# Patient Record
Sex: Female | Born: 1935 | Race: White | Hispanic: No | Marital: Married | Smoking: Former smoker
Health system: Southern US, Community
[De-identification: ages and names within clinical notes are randomized; demographics above are authoritative.]

## PROBLEM LIST (undated history)

## (undated) DIAGNOSIS — I10 Essential (primary) hypertension: Secondary | ICD-10-CM

## (undated) DIAGNOSIS — I712 Thoracic aortic aneurysm, without rupture: Secondary | ICD-10-CM

## (undated) DIAGNOSIS — J449 Chronic obstructive pulmonary disease, unspecified: Secondary | ICD-10-CM

## (undated) DIAGNOSIS — I739 Peripheral vascular disease, unspecified: Secondary | ICD-10-CM

## (undated) DIAGNOSIS — I2510 Atherosclerotic heart disease of native coronary artery without angina pectoris: Secondary | ICD-10-CM

## (undated) DIAGNOSIS — Z720 Tobacco use: Secondary | ICD-10-CM

## (undated) DIAGNOSIS — R9431 Abnormal electrocardiogram [ECG] [EKG]: Secondary | ICD-10-CM

## (undated) HISTORY — PX: FETAL SURGERY FOR CONGENITAL HERNIA: SHX1618

## (undated) HISTORY — DX: Essential (primary) hypertension: I10

## (undated) HISTORY — DX: Abnormal electrocardiogram (ECG) (EKG): R94.31

## (undated) HISTORY — DX: Chronic obstructive pulmonary disease, unspecified: J44.9

## (undated) HISTORY — DX: Thoracic aortic aneurysm, without rupture: I71.2

## (undated) HISTORY — DX: Tobacco use: Z72.0

## (undated) HISTORY — DX: Atherosclerotic heart disease of native coronary artery without angina pectoris: I25.10

## (undated) HISTORY — DX: Peripheral vascular disease, unspecified: I73.9

---

## 1976-04-06 HISTORY — PX: HYSTEROTOMY: SHX1776

## 1990-04-06 HISTORY — PX: PLACEMENT OF BREAST IMPLANTS: SHX6334

## 1990-04-06 HISTORY — PX: BREAST IMPLANT EXCHANGE: SHX6296

## 2000-04-06 HISTORY — PX: NASAL SINUS SURGERY: SHX719

## 2001-05-12 ENCOUNTER — Ambulatory Visit (HOSPITAL_COMMUNITY): Admission: RE | Admit: 2001-05-12 | Discharge: 2001-05-12 | Payer: Self-pay | Admitting: *Deleted

## 2003-04-07 HISTORY — PX: EYE SURGERY: SHX253

## 2003-04-07 HISTORY — PX: HIP SURGERY: SHX245

## 2003-04-07 HISTORY — PX: FEMUR FRACTURE SURGERY: SHX633

## 2013-07-05 ENCOUNTER — Telehealth: Payer: Self-pay | Admitting: Interventional Cardiology

## 2013-07-05 NOTE — Telephone Encounter (Signed)
New Message:  Pt states she has a thoracic aneurysm.. Pt would like Dr. Katrinka BlazingSmith to recommend a surgeon.

## 2013-07-06 NOTE — Telephone Encounter (Signed)
returned pt call.pt sts that she has had a ct done with [facility].pt sts thats she was told she has a thoracic aneurysm.pt sts that she has not been given any other info.i.e.if she just needs f/u or surgery.pt has been calling around on her own trying to find a Careers advisersurgeon.I adv pt to contact the ordering physician for their  recommendations and a better explanation on how dx should be treated.pt is not a pt of Dr.Smith, pt husband is.she is going to the [facility] office today to sign a release to have her records fwd to this office for Dr.Smith to review.adv pt that Dr.Smith wont return to the office until 07/11/13.I will send him a message FYI. pt wants Dr.Smith to recommend a surgeon.adv her that Dr.Smith could review her records and possibly give a recommendation after reviewing.pt and pt husband are quite concerned.pt husband called the office yesterday yelling at the operator.pt thanked me for my help.is agreeable with plan and verbalized understanding.

## 2013-07-06 NOTE — Telephone Encounter (Signed)
Got it.

## 2013-08-15 ENCOUNTER — Telehealth: Payer: Self-pay | Admitting: Interventional Cardiology

## 2013-08-15 NOTE — Telephone Encounter (Signed)
returned pt call.pt would like a  new pt appt with Dr.Smith for dx aortic thoracic aneurysm.appt made for 08/31/13 @4pm . pt will mail records to office today.pt aware od appt and verbalized understanding.

## 2013-08-15 NOTE — Telephone Encounter (Signed)
New message     Talk to Misty StanleyLisa about a conversation she had with you about setting up a new pt appt with Dr Katrinka BlazingSmith

## 2013-08-31 ENCOUNTER — Encounter: Payer: Self-pay | Admitting: Interventional Cardiology

## 2013-08-31 ENCOUNTER — Ambulatory Visit (INDEPENDENT_AMBULATORY_CARE_PROVIDER_SITE_OTHER): Payer: Medicare Other | Admitting: Interventional Cardiology

## 2013-08-31 VITALS — BP 124/66 | HR 66 | Ht 63.0 in | Wt 101.0 lb

## 2013-08-31 DIAGNOSIS — I10 Essential (primary) hypertension: Secondary | ICD-10-CM

## 2013-08-31 DIAGNOSIS — I341 Nonrheumatic mitral (valve) prolapse: Secondary | ICD-10-CM

## 2013-08-31 DIAGNOSIS — I712 Thoracic aortic aneurysm, without rupture, unspecified: Secondary | ICD-10-CM

## 2013-08-31 DIAGNOSIS — F17200 Nicotine dependence, unspecified, uncomplicated: Secondary | ICD-10-CM

## 2013-08-31 DIAGNOSIS — Z720 Tobacco use: Secondary | ICD-10-CM

## 2013-08-31 DIAGNOSIS — J449 Chronic obstructive pulmonary disease, unspecified: Secondary | ICD-10-CM | POA: Insufficient documentation

## 2013-08-31 DIAGNOSIS — R002 Palpitations: Secondary | ICD-10-CM | POA: Insufficient documentation

## 2013-08-31 DIAGNOSIS — R9431 Abnormal electrocardiogram [ECG] [EKG]: Secondary | ICD-10-CM

## 2013-08-31 DIAGNOSIS — I739 Peripheral vascular disease, unspecified: Secondary | ICD-10-CM | POA: Insufficient documentation

## 2013-08-31 DIAGNOSIS — I059 Rheumatic mitral valve disease, unspecified: Secondary | ICD-10-CM

## 2013-08-31 DIAGNOSIS — Z8679 Personal history of other diseases of the circulatory system: Secondary | ICD-10-CM | POA: Insufficient documentation

## 2013-08-31 HISTORY — DX: Peripheral vascular disease, unspecified: I73.9

## 2013-08-31 HISTORY — DX: Tobacco use: Z72.0

## 2013-08-31 HISTORY — DX: Thoracic aortic aneurysm, without rupture, unspecified: I71.20

## 2013-08-31 HISTORY — DX: Thoracic aortic aneurysm, without rupture: I71.2

## 2013-08-31 HISTORY — DX: Chronic obstructive pulmonary disease, unspecified: J44.9

## 2013-08-31 HISTORY — DX: Abnormal electrocardiogram (ECG) (EKG): R94.31

## 2013-08-31 HISTORY — DX: Essential (primary) hypertension: I10

## 2013-08-31 NOTE — Progress Notes (Signed)
Patient ID: Lindsey Martinez, female   DOB: [birth year], 80 y.o.   MRN: [id number]   Date: 08/31/2013 ID: Lindsey Martinez, DOB 12-24-1930, MRN [id number] PCP: No primary provider on file.  Reason: Establish cardiac followup  ASSESSMENT;  1. Mitral valve prolapse, questionable diagnosis 2. Peripheral arterial disease 3. Abnormal EKG with anterior T-wave abnormality 4. Lower thoracic aneurysm by CT scan of the abdomen and pelvis performed recently  PLAN:  1. May need to have a chest CT performed to rule out ascending and descending aortic aneurysm 2. No specific cardiac evaluation needed at this time 3. Risk factor modification including statin therapy and smoking cessation were discussed. She does not feel she would be able to stop smoking. She states that her lipid levels have not been evaluated recently. 4. Fasting lipid panel today. She will need statin therapy if we document LDL greater than 100   SUBJECTIVE: Lindsey Martinez is a 80 y.o. female who is his asymptomatic from a cardiac viewpoint. She has had palpitations in the past and has undergone evaluation by Dr. Luberta Robertson in [facility]. Evaluation has included multiple diagnostic studies including echocardiograms have not demonstrated significant mitral valve prolapse. Holter monitoring without evidence of significant arrhythmia. Abdominal and vascular ultrasound studies demonstrated heavy abdominal aortic calcification. There is also a myocardial perfusion study performed in 2010 that did not demonstrate evidence of ischemia and normal LV function. She has no edema. She denies palpitations. She has not had syncope.  Her major concern is that a recent pelvic and abdominal CT scan revealed heavy abdominal atherosclerosis and calcification. She was also diagnosed with enlargement of the distal thoracic aorta. She saw a vascular specialist at [facility].   Allergies  Allergen Reactions  . Codeine   . Demerol  [Meperidine]   . Lidoderm [Lidocaine]   . Sulfa Antibiotics     No current outpatient prescriptions on file prior to visit.   No current facility-administered medications on file prior to visit.    History reviewed. No pertinent past medical history.  History reviewed. No pertinent past surgical history.  History   Social History  . Marital Status: Married    Spouse Name: N/A    Number of Children: N/A  . Years of Education: N/A   Occupational History  . Not on file.   Social History Main Topics  . Smoking status: Current Every Day Smoker  . Smokeless tobacco: Not on file  . Alcohol Use: Yes  . Drug Use: No  . Sexual Activity: Not on file   Other Topics Concern  . Not on file   Social History Narrative  . No narrative on file    Family History  Problem Relation Age of Onset  . Adopted: Yes  . Family history unknown: Yes    ROS: No neurological complaints. Denies claudication. She has not had syncope or palpitations. She denies abdominal pain postprandial.. Other systems negative for complaints.  OBJECTIVE: BP 124/66  Pulse 66  Ht 5\' 3"  (1.6 m)  Wt 101 lb (45.813 kg)  BMI 17.90 kg/m2,  General: No acute distress, slender, elderly, and tanned HEENT: normal without pallor Neck: JVD flat. Carotids no bruits. Upstrokes are 2+ and symmetric. Chest: Clear Cardiac: Murmur: No significant murmur. Gallop: Absent. Rhythm: Normal. Other: Normal Abdomen: Bruit: Absent. Pulsation: Absent Extremities: Edema: Absent. Pulses: 2+ posterior tibials bilaterally Neuro: Normal Psych: Ages  ECG: Normal sinus rhythm with precordial T-wave abnormality but otherwise no significant findings. No change when compared to  old tracings obtained from Dr. Luberta Robertsonrowell

## 2013-08-31 NOTE — Patient Instructions (Signed)
Your physician recommends that you continue on your current medications as directed. Please refer to the Current Medication list given to you today.  Your physician wants you to follow-up in: 1 year. You will receive a reminder letter in the mail two months in advance. If you don't receive a letter, please call our office to schedule the follow-up appointment.  

## 2015-02-18 ENCOUNTER — Ambulatory Visit (INDEPENDENT_AMBULATORY_CARE_PROVIDER_SITE_OTHER): Payer: Medicare Other | Admitting: Interventional Cardiology

## 2015-02-18 ENCOUNTER — Encounter: Payer: Self-pay | Admitting: Interventional Cardiology

## 2015-02-18 VITALS — BP 160/88 | HR 73 | Ht 63.5 in | Wt 104.1 lb

## 2015-02-18 DIAGNOSIS — I739 Peripheral vascular disease, unspecified: Secondary | ICD-10-CM

## 2015-02-18 DIAGNOSIS — Z01818 Encounter for other preprocedural examination: Secondary | ICD-10-CM | POA: Diagnosis not present

## 2015-02-18 DIAGNOSIS — I341 Nonrheumatic mitral (valve) prolapse: Secondary | ICD-10-CM

## 2015-02-18 DIAGNOSIS — I2510 Atherosclerotic heart disease of native coronary artery without angina pectoris: Secondary | ICD-10-CM

## 2015-02-18 DIAGNOSIS — I10 Essential (primary) hypertension: Secondary | ICD-10-CM | POA: Diagnosis not present

## 2015-02-18 DIAGNOSIS — R0609 Other forms of dyspnea: Secondary | ICD-10-CM

## 2015-02-18 DIAGNOSIS — Z720 Tobacco use: Secondary | ICD-10-CM

## 2015-02-18 DIAGNOSIS — J439 Emphysema, unspecified: Secondary | ICD-10-CM

## 2015-02-18 DIAGNOSIS — I712 Thoracic aortic aneurysm, without rupture, unspecified: Secondary | ICD-10-CM

## 2015-02-18 HISTORY — DX: Atherosclerotic heart disease of native coronary artery without angina pectoris: I25.10

## 2015-02-18 LAB — BASIC METABOLIC PANEL
BUN: 23 mg/dL (ref 7–25)
CO2: 19 mmol/L — ABNORMAL LOW (ref 20–31)
Calcium: 9.1 mg/dL (ref 8.6–10.4)
Chloride: 101 mmol/L (ref 98–110)
Creat: 1.01 mg/dL — ABNORMAL HIGH (ref 0.60–0.88)
Glucose, Bld: 74 mg/dL (ref 65–99)
POTASSIUM: 4.4 mmol/L (ref 3.5–5.3)
SODIUM: 133 mmol/L — AB (ref 135–146)

## 2015-02-18 NOTE — Progress Notes (Signed)
Cardiology Office Note   Date:  02/18/2015   ID:  Lindsey Martinez, DOB 08/21/1930, MRN [id number]  PCP:  Ninetta LightsFURR,SARA, MD  Cardiologist:  Lesleigh NoeSMITH III,HENRY W, MD   Chief Complaint  Patient presents with  . Abnormal ECG      History of Present Illness: Lindsey Martloise Halladay is a 81 y.o. female who presents for aortic and arterial sclerosis, abnormal EKG, COPD, continued tobacco abuse, and blindness left eye. Pelvic CT scan in 2015 demonstrating dilatation of the distal thoracic aorta. Further evaluation was never performed by the patient  Over the past 2 months the patient is noted progressive exertional dyspnea. She has been diagnosed with exertional bronchospasm. She continues to smoke one pack of cigarettes per day. She denies chest discomfort with activity. A lower extreme the swelling. She has not had syncope or prolonged episodes of chest pain.    Past Medical History  Diagnosis Date  . Essential hypertension 08/31/2013  . Thoracic aortic aneurysm (HCC) 08/31/2013    Never fully characterize. The lower thoracic aorta was felt to be mildly enlarged on abdominal CT scan. Recommendations were made for a chest CT scan which was never performed   . Peripheral arterial disease (HCC) 08/31/2013    Heavy calcification noted in the abdominal aorta and into the iliacs bilaterally on CT with contrast   . Coronary artery calcification seen on CT scan 02/18/2015  . COPD (chronic obstructive pulmonary disease) (HCC) 08/31/2013  . Tobacco abuse 08/31/2013  . Abnormal EKG 08/31/2013    Anterior T-wave abnormality     No past surgical history on file.   Current Outpatient Prescriptions  Medication Sig Dispense Refill  . budesonide-formoterol (SYMBICORT) 160-4.5 MCG/ACT inhaler Inhale 2 puffs into the lungs 2 (two) times daily.    . Cyanocobalamin (V-R VITAMIN B-12 PO) Take 2,000 mcg by mouth daily.    . megestrol (MEGACE) 40 MG/ML suspension Take 10 mLs by mouth 2 (two) times daily.  2  . PROAIR HFA  108 (90 BASE) MCG/ACT inhaler Inhale 1-2 puffs into the lungs every 6 (six) hours as needed. (Shortness of breath)  1  . tiotropium (SPIRIVA) 18 MCG inhalation capsule Place 18 mcg into inhaler and inhale daily.    . trandolapril-verapamil (TARKA) 2-240 MG per tablet Take 1 tablet by mouth daily.     No current facility-administered medications for this visit.    Allergies:   Codeine; Demerol; Lidoderm; and Sulfa antibiotics    Social History:  The patient  reports that she has been smoking.  She does not have any smokeless tobacco history on file. She reports that she drinks alcohol. She reports that she does not use illicit drugs.   Family History:  The patient's She was adopted. Family history is unknown by patient.    ROS:  Please see the history of present illness.   Otherwise, review of systems are positive for dyspnea on exertion. Increasing use of inhalers. Recent diagnosis of exercise-induced asthma. Okaying up to one pack of cigarettes daily..   All other systems are reviewed and negative.    PHYSICAL EXAM: VS:  BP 160/88 mmHg  Pulse 73  Ht 5' 3.5" (1.613 m)  Wt 47.229 kg (104 lb 1.9 oz)  BMI 18.15 kg/m2 , BMI Body mass index is 18.15 kg/(m^2). GEN: Well nourished, well developed, in no acute distress HEENT: normal Neck: no JVD, carotid bruits, or masses Cardiac: RRR.  There is no murmur, rub, or gallop. There is no edema. Varicose veins are  noted.. Respiratory:  clear to auscultation bilaterally, normal work of breathing. GI: soft, nontender, nondistended, + BS MS: no deformity or atrophy Skin: warm and dry, no rash Neuro:  Strength and sensation are intact Psych: euthymic mood, full affect   EKG:  EKG is ordered today. The ekg reveals normal sinus rhythm with precordial T-wave abnormality compatible with ischemia. EKG changes or unchanged compared with prior tracings   Recent Labs: No results found for requested labs within last 365 days.    Lipid Panel No  results found for: CHOL, TRIG, HDL, CHOLHDL, VLDL, LDLCALC, LDLDIRECT    Wt Readings from Last 3 Encounters:  02/18/15 47.229 kg (104 lb 1.9 oz)  08/31/13 45.813 kg (101 lb)      Other studies Reviewed: Additional studies/ records that were reviewed today include: Reviewed records from Dr. Verdie Shire. The findings include aortic atherosclerosis. No evidence of significant prolapse. Normal LV function. Negative nuclear study for ischemia. All this information is from 2010 or before.. A CT scan of the abdomen done a year ago demonstrated descending thoracic aortic enlargement but the study was incomplete to make a specific diagnosis.   ASSESSMENT AND PLAN:  1. Dyspnea. Rule out anginal equivalent Known CAD based on prior CT scans demonstrating coronary calcification. Previous normal myocardial perfusion study, remote. ECG is chronically abnormal with anterior T-wave inversion V2 through V4. Ischemia remains a possible explanation.  2. Mitral valve prolapse No auscultatory evidence of this diagnosis and no echo evidence based upon most recent study from 2012.  3. Essential hypertension Elevated  4. Thoracic aortic aneurysm without rupture (HCC) Dilated aorta but never fully evaluated. Abdominal aortic ultrasound in 2010 demonstrated heavy aortic calcification  5. Pulmonary emphysema, unspecified emphysema type (HCC) Continued smoking and progression of COPD is likely  6. Peripheral arterial disease (HCC) Based upon prior imaging studies.    Current medicines are reviewed at length with the patient today.  The patient has the following concerns regarding medicines: None.  The following changes/actions have been instituted:    CT angio of aorta to rule out thoracic and abdominal aortic aneurysm  Lexiscan myocardial perfusion study  Labs/ tests ordered today include:   Orders Placed This Encounter  Procedures  . CT ANGIO CHEST AORTA W/CM &/OR WO/CM  . Basic  metabolic panel  . Myocardial Perfusion Imaging  . EKG 12-Lead     Disposition:   FU with HS in 1  year  Signed, Lesleigh Noe, MD  02/18/2015 4:08 PM    [facility] [address] [place], [place], [place]  [address] Phone: [phone]; Fax: [phone]

## 2015-02-18 NOTE — Patient Instructions (Signed)
Medication Instructions:  Your physician recommends that you continue on your current medications as directed. Please refer to the Current Medication list given to you today.   Labwork: [facility]met Today  Testing/Procedures: Your physician has requested that you have a lexiscan myoview. For further information please visit [email or web address]. Please follow instruction sheet, as given.  Non-Cardiac CT Angiography (CTA), is a special type of CT scan that uses a computer to produce multi-dimensional views of major blood vessels throughout the body. In CT angiography, a contrast material is injected through an IV to help visualize the blood vessels   Follow-Up: Your physician wants you to follow-up in: 1 year or sooner pending results You will receive a reminder letter in the mail two months in advance. If you don't receive a letter, please call our office to schedule the follow-up appointment.   Any Other Special Instructions Will Be Listed Below (If Applicable).     If you need a refill on your cardiac medications before your next appointment, please call your pharmacy.

## 2015-02-19 ENCOUNTER — Telehealth: Payer: Self-pay

## 2015-02-19 NOTE — Telephone Encounter (Signed)
-----   Message from Lyn RecordsHenry W Smith, MD sent at 02/19/2015  8:04 AM EST ----- There is evidence of mild kidney impairment. Drink much water and fluid prior to the CT scan.

## 2015-02-19 NOTE — Telephone Encounter (Signed)
Pt aware of lab results and Dr.Smith's recommendation. There is evidence of mild kidney impairment. Drink much water and fluid prior to the CT scan. Pt verbalized understanding.

## 2015-03-04 ENCOUNTER — Telehealth (HOSPITAL_COMMUNITY): Payer: Self-pay | Admitting: *Deleted

## 2015-03-04 NOTE — Telephone Encounter (Signed)
Patient given detailed instructions per Myocardial Perfusion Study Information Sheet for the test on 03/06/15 at 0945. Patient notified to arrive 15 minutes early and that it is imperative to arrive on time for appointment to keep from having the test rescheduled.  If you need to cancel or reschedule your appointment, please call the office within 24 hours of your appointment. Failure to do so may result in a cancellation of your appointment, and a $50 no show fee. Patient verbalized understanding.Lindsey Martinez    

## 2015-03-06 ENCOUNTER — Ambulatory Visit (INDEPENDENT_AMBULATORY_CARE_PROVIDER_SITE_OTHER)
Admission: RE | Admit: 2015-03-06 | Discharge: 2015-03-06 | Disposition: A | Discharge status: Home / Self Care | Payer: Medicare Other | Source: Ambulatory Visit | Attending: Interventional Cardiology | Admitting: Interventional Cardiology

## 2015-03-06 ENCOUNTER — Ambulatory Visit (HOSPITAL_COMMUNITY): Payer: Medicare Other | Attending: Interventional Cardiology

## 2015-03-06 DIAGNOSIS — I712 Thoracic aortic aneurysm, without rupture, unspecified: Secondary | ICD-10-CM

## 2015-03-06 DIAGNOSIS — R0609 Other forms of dyspnea: Secondary | ICD-10-CM | POA: Insufficient documentation

## 2015-03-06 DIAGNOSIS — I2510 Atherosclerotic heart disease of native coronary artery without angina pectoris: Secondary | ICD-10-CM | POA: Diagnosis not present

## 2015-03-06 DIAGNOSIS — I10 Essential (primary) hypertension: Secondary | ICD-10-CM | POA: Diagnosis not present

## 2015-03-06 LAB — MYOCARDIAL PERFUSION IMAGING
CHL CUP NUCLEAR SDS: 5
CHL CUP NUCLEAR SSS: 5
CSEPPHR: 105 {beats}/min
LHR: 0.28
LVDIAVOL: 55 mL
LVSYSVOL: 15 mL
Rest HR: 78 {beats}/min
SRS: 0
TID: 0.98

## 2015-03-06 MED ORDER — REGADENOSON 0.4 MG/5ML IV SOLN
0.4000 mg | Freq: Once | INTRAVENOUS | Status: AC
  Administered 2015-03-06: 0.4 mg via INTRAVENOUS

## 2015-03-06 MED ORDER — TECHNETIUM TC 99M SESTAMIBI GENERIC - CARDIOLITE
32.8000 | Freq: Once | INTRAVENOUS | Status: AC | PRN
  Administered 2015-03-06: 32.8 via INTRAVENOUS

## 2015-03-06 MED ORDER — IOHEXOL 350 MG/ML SOLN
100.0000 mL | Freq: Once | INTRAVENOUS | Status: AC | PRN
  Administered 2015-03-06: 80 mL via INTRAVENOUS

## 2015-03-06 MED ORDER — TECHNETIUM TC 99M SESTAMIBI GENERIC - CARDIOLITE
10.8000 | Freq: Once | INTRAVENOUS | Status: AC | PRN
  Administered 2015-03-06: 11 via INTRAVENOUS

## 2015-03-07 ENCOUNTER — Telehealth: Payer: Self-pay

## 2015-03-07 DIAGNOSIS — I712 Thoracic aortic aneurysm, without rupture, unspecified: Secondary | ICD-10-CM

## 2015-03-07 MED ORDER — METOPROLOL SUCCINATE ER 25 MG PO TB24: 12.5000 mg | ORAL_TABLET | Freq: Every day | ORAL | Status: DC

## 2015-03-07 NOTE — Telephone Encounter (Signed)
Pt aware of myoview results with verbal understanding.  Pt aware of CT results  There is a large aortic aneurysm that needs to be followed along by cardiac surgery. Needs consult with Dr. Tyrone SageGerhardt. Start metoprolol succinate, 12.5 mg daily. OV 6 weeks Rx sent to pt pharmacy, f/u appt scheduled with Dr.Smith for 04/30/15 @ 4:15pm. Adv pt that a scheduler will call her to schedule her consult with Dr.Gerhardt. Adv pt to call if she feel that she is not tolerating the new med. Pt agreeable with plan and verbalized understanding.

## 2015-03-07 NOTE — Telephone Encounter (Signed)
-----   Message from Lyn RecordsHenry W Smith, MD sent at 03/06/2015  8:04 PM EST ----- There is a large aortic aneurysm that needs to be followed along by cardiac surgery. Needs consult with Dr. Tyrone SageGerhardt. Start metoprolol succinate, 12.5 mg daily. OV 6 weeks

## 2015-03-08 ENCOUNTER — Encounter: Payer: Self-pay | Admitting: Cardiothoracic Surgery

## 2015-03-08 ENCOUNTER — Institutional Professional Consult (permissible substitution) (INDEPENDENT_AMBULATORY_CARE_PROVIDER_SITE_OTHER): Payer: Medicare Other | Admitting: Cardiothoracic Surgery

## 2015-03-08 VITALS — BP 140/80 | HR 88 | Resp 20 | Ht 63.5 in | Wt 104.0 lb

## 2015-03-08 DIAGNOSIS — I2510 Atherosclerotic heart disease of native coronary artery without angina pectoris: Secondary | ICD-10-CM

## 2015-03-08 DIAGNOSIS — I712 Thoracic aortic aneurysm, without rupture, unspecified: Secondary | ICD-10-CM

## 2015-03-08 NOTE — Progress Notes (Signed)
[address]       [place] [id number]             [phone]                    Lindsey Martinez [facility] Medical Record #[id number] Date of Birth: 07/22/1930  Referring: Lyn RecordsSmith, Henry W, MD Primary Care: Ninetta LightsFURR,SARA, MD  Chief Complaint:    Chief Complaint  Patient presents with  . Thoracic Aortic Aneurysm    Surgical eval, CTA Chest 03/06/15    History of Present Illness:    Lindsey Martinez 81 y.o. female is seen in the office  today for evaluation of dilated ascending aorta. The patient is a long-term smoker and currently smoking with significant underlying pulmonary disease. This does limit her overall physical activity. She has significant increase shortness of breath with minimal activity and has had episodes of COPD exacerbation. She is not currently on oxygen nor does she currently see a pulmonologist . Over the past several months she's had bouts of bronchitis treated with antibiotics. Because of the progressive exertional dyspnea she was seen by Dr. Verdis PrimeHenry Smith cardiology and a follow-up CT scan of the chest was done. The patient has been evaluated [facility] for thoracoabdominal aneurysm. Unfortunately do not have complete records of this specifically the CT scans of the chest that were done   She has a history of double vision and wears a patch on her left eye She has no family history of aortic dissection   Current Activity/ Functional Status:  Patient is independent with mobility/ambulation, transfers, ADL's, IADL's.   Zubrod Score: At the time of surgery this patient's most appropriate activity status/level should be described as: []     0    Normal activity, no symptoms []     1    Restricted in physical strenuous activity but ambulatory, able to do out light work [x]     2    Ambulatory and capable of self care, unable to do work activities, up and about               >50 % of waking hours                               []     3    Only limited self care, in bed greater than 50% of waking hours []     4    Completely disabled, no self care, confined to bed or chair []     5    Moribund   Past Medical History  Diagnosis Date  . Essential hypertension 08/31/2013  . Thoracic aortic aneurysm (HCC) 08/31/2013    Never fully characterize. The lower thoracic aorta was felt to be mildly enlarged on abdominal CT scan. Recommendations were made for a chest CT scan which was never performed   . Peripheral arterial disease (HCC) 08/31/2013    Heavy calcification noted in the abdominal aorta and into the iliacs bilaterally on CT with contrast   . Coronary artery calcification seen on CT scan 02/18/2015  . COPD (chronic obstructive pulmonary disease) (HCC) 08/31/2013  . Tobacco abuse 08/31/2013  . Abnormal EKG 08/31/2013    Anterior T-wave abnormality     No past surgical history on file.  Family History  Problem Relation Age of Onset  . Adopted: Yes  . Family history unknown: Yes  Social History   Social History  . Marital Status: Married    Spouse Name: N/A  . Number of Children: N/A  . Years of Education: N/A   Occupational History  . Not on file.   Social History Main Topics  . Smoking status: Current Every Day Smoker  . Smokeless tobacco: Not on file  . Alcohol Use: Yes  . Drug Use: No  . Sexual Activity: No   Other Topics Concern  . Not on file   Social History Narrative    History  Smoking status  . Current Every Day Smoker  Smokeless tobacco  . Not on file    History  Alcohol Use  . Yes     Allergies  Allergen Reactions  . Codeine Nausea And Vomiting  . Demerol [Meperidine] Other (See Comments)    REACTION: "HALLUCINATIONS"  . Lidoderm [Lidocaine] Other (See Comments)    REACTION:"SPEEDS UP BP"  . Sulfa Antibiotics Nausea Only    Current Outpatient Prescriptions  Medication Sig Dispense Refill  . budesonide-formoterol (SYMBICORT) 160-4.5 MCG/ACT inhaler Inhale 2  puffs into the lungs 2 (two) times daily.    . Cyanocobalamin (V-R VITAMIN B-12 PO) Take 2,000 mcg by mouth daily.    . megestrol (MEGACE) 40 MG/ML suspension Take 10 mLs by mouth 2 (two) times daily.  2  . metoprolol succinate (TOPROL XL) 25 MG 24 hr tablet Take 0.5 tablets (12.5 mg total) by mouth daily. 15 tablet 11  . PROAIR HFA 108 (90 BASE) MCG/ACT inhaler Inhale 1-2 puffs into the lungs every 6 (six) hours as needed. (Shortness of breath)  1  . tiotropium (SPIRIVA) 18 MCG inhalation capsule Place 18 mcg into inhaler and inhale daily.    . trandolapril-verapamil (TARKA) 2-240 MG per tablet Take 1 tablet by mouth daily.     No current facility-administered medications for this visit.      Review of Systems:     Cardiac Review of Systems: Y or N  Chest Pain [  n  ]  Resting SOB [ y  ] Exertional SOB  Cove.Etienne  ]  Pollyann Kennedy Milo.Brash  ]   Pedal Edema [ y  ]    Palpitations [ n ] Syncope  [n  ]   Presyncope [ n  ]  General Review of Systems: [Y] = yes [  ]=no Constitional: recent weight change [ n ];  Wt loss over the last 3 months [   ] anorexia [  ]; fatigue [  ]; nausea [  ]; night sweats [  ]; fever [  ]; or chills [  ];          Dental: poor dentition[  ]; Last Dentist visit:   Eye : blurred vision [  ]; diplopia [   ]; vision changes [  ];  Amaurosis fugax[  ]; Resp: cough [  y];  wheezing[y ];  hemoptysis[ n ]; shortness of breath[ y ]; paroxysmal nocturnal dyspnea[y  ]; dyspnea on exertion[y  ]; or orthopnea[ y ];  GI:  gallstones[ n ], vomiting[ n ];  dysphagia[ n ]; melena[  ];  hematochezia [n  ]; heartburn[n  ];   Hx of  Colonoscopy[  ]; GU: kidney stones [  ]; hematuria[  ];   dysuria [  ];  nocturia[  ];  history of     obstruction [  ]; urinary frequency [  ]  Skin: rash, swelling[  ];, hair loss[  ];  peripheral edema[  ];  or itching[  ]; Musculosketetal: myalgias[  ];  joint swelling[  ];  joint erythema[  ];  joint pain[  ];  back pain[  ];  Heme/Lymph: bruising[  ];   bleeding[  ];  anemia[  ];  Neuro: TIA[  ];  headaches[  ];  stroke[  ];  vertigo[  ];  seizures[  ];   paresthesias[  ];  difficulty walking[  ];  Psych:depression[  ]; anxiety[  ];  Endocrine: diabetes[  ];  thyroid dysfunction[  ];  Immunizations: Flu up to date [  ]; Pneumococcal up to date [  ];  Other:  Physical Exam: BP 140/80 mmHg  Pulse 88  Resp 20  Ht 5' 3.5" (1.613 m)  Wt 104 lb (47.174 kg)  BMI 18.13 kg/m2  SpO2 96%  PHYSICAL EXAMINATION: General appearance: alert, cooperative, appears older than stated age, cachectic and no distress Head: Normocephalic, without obvious abnormality, atraumatic Neck: no adenopathy, no carotid bruit, no JVD, supple, symmetrical, trachea midline and thyroid not enlarged, symmetric, no tenderness/mass/nodules Lymph nodes: Cervical, supraclavicular, and axillary nodes normal. Resp: diminished breath sounds bilaterally and no active wheezing  Back: symmetric, no curvature. ROM normal. No CVA tenderness. Cardio: regular rate and rhythm, S1, S2 normal, no murmur, click, rub or gallop GI: soft, non-tender; bowel sounds normal; no masses,  no organomegaly Extremities: extremities normal, atraumatic, no cyanosis or edema and Homans sign is negative, no sign of DVT Neurologic: Grossly normal  Diagnostic Studies & Laboratory data:     Recent Radiology Findings:   Ct Angio Chest Aorta W/cm &/or Wo/cm  03/06/2015  CLINICAL DATA:  Routine follow up Thoracic aortic aneurysm. Complains of recent bronchitis and hx of COPD. On O2. Worsening SOB for about 10 days. EXAM: CT ANGIOGRAPHY CHEST WITH CONTRAST TECHNIQUE: Multidetector CT imaging of the chest was performed using the standard protocol during bolus administration of intravenous contrast. Multiplanar CT image reconstructions and MIPs were obtained to evaluate the vascular anatomy. CONTRAST:  80mL OMNIPAQUE IOHEXOL 350 MG/ML SOLN COMPARISON:  CT abdomen 07/03/2013 by report only FINDINGS: SVC patent.  Incomplete opacification of pulmonary arterial tree ; the exam was not optimized for detection of pulmonary emboli. Patient motion degrades images through the lung bases. Patent pulmonary veins. Scattered coronary calcifications. Dilatation of the proximal ascending aorta above the sino-tubular junction up to 5 cm maximum transverse diameter, distal ascending/ proximal arch 4 cm, distal arch 2.8 cm, proximal descending 3.8 cm, mid descending 3.7 cm, distal descending 4 cm, supraceliac abdominal aorta 3.2 cm, tapering to a diameter of 2.6 cm in the suprarenal segment. There is some eccentric mural thrombus in the distal descending and suprarenal segments without significant stenosis. No dissection. There is classic 3 vessel brachiocephalic arterial origin anatomy without proximal stenosis. Scattered calcified plaque in the aortic arch and descending segment. No pleural or pericardial effusion. Subcentimeter prevascular lymph node. No hilar adenopathy. Emphysematous changes most marked in the lung apices. Subsegmental atelectasis or scarring posteriorly in the right lower lobe. Bilateral breast implants. Minimal spurring in the mid thoracic spine. Sternum intact. Visualized nonvascular portions of the upper abdomen unremarkable. Review of the MIP images confirms the above findings. IMPRESSION: 1. Thoracic aortic aneurysm with 5 cm ascending component, 4 cm descending component, extending into 3.2 cm proximal abdominal segment, not visualized distally. 2. Pulmonary emphysema. 3. Atherosclerosis, including aortic and coronary artery disease. Please note that although the  presence of coronary artery calcium documents the presence of coronary artery disease, the severity of this disease and any potential stenosis cannot be assessed on this non-gated CT examination. Assessment for potential risk factor modification, dietary therapy or pharmacologic therapy may be warranted, if clinically indicated. Electronically Signed    By: Corlis Leak M.D.   On: 03/06/2015 10:11     I have independently reviewed the above radiologic studies.  Echo reports of outside echocardiogram done in 2012 [facility] suggest moderate tricuspid regurgitation and right ventricular dysfunction,   Recent Lab Findings: Lab Results  Component Value Date   GLUCOSE 74 02/18/2015   NA 133* 02/18/2015   K 4.4 02/18/2015   CL 101 02/18/2015   CREATININE 1.01* 02/18/2015   BUN 23 02/18/2015   CO2 19* 02/18/2015   Aortic Size Index=    5    /Body surface area is 1.45 meters squared. = 3.4  < 2.75 cm/m2      4% risk per year 2.75 to 4.25          8% risk per year > 4.25 cm/m2    20% risk per year     Assessment / Plan:   The patient's current CT scan is reviewed, on CT scan she has dilatation of her a ascending and descending aorta, evidence of emphysema, and significant calcification of coronary arteries. At this point I discussed with the patient the signs and symptoms of dilated descending aorta, the reasons for surgical intervention. Because of her age and severe underlying pulmonary disease she would be a very poor candidate for elective replacement of her ascending aorta. She will obtain pulmonary function studies, and will obtain CT scans of the chest that she's had done at other institutions in the past and send them to [place] for review.  She will have a repeat CT scan of the chest done in 6 months She will talk to her primary care doctor concerning referral to a pulmonologist. I discussed with the patient the need to stop smoking.  I  spent 40 minutes counseling the patient face to face and 50% or more the  time was spent in counseling and coordination of care. The total time spent in the appointment was 60 minutes.  Delight Ovens MD      [address] [place] [place] [address] Office [phone]   Beeper [phone]  03/08/2015 10:48 AM

## 2015-04-30 ENCOUNTER — Ambulatory Visit (INDEPENDENT_AMBULATORY_CARE_PROVIDER_SITE_OTHER): Payer: Self-pay | Admitting: Interventional Cardiology

## 2015-04-30 DIAGNOSIS — I341 Nonrheumatic mitral (valve) prolapse: Secondary | ICD-10-CM

## 2015-04-30 DIAGNOSIS — I712 Thoracic aortic aneurysm, without rupture: Secondary | ICD-10-CM

## 2015-04-30 DIAGNOSIS — J449 Chronic obstructive pulmonary disease, unspecified: Secondary | ICD-10-CM

## 2015-04-30 DIAGNOSIS — R0609 Other forms of dyspnea: Secondary | ICD-10-CM

## 2015-04-30 DIAGNOSIS — R9431 Abnormal electrocardiogram [ECG] [EKG]: Secondary | ICD-10-CM

## 2015-04-30 DIAGNOSIS — I739 Peripheral vascular disease, unspecified: Secondary | ICD-10-CM

## 2015-05-01 NOTE — Progress Notes (Signed)
No show

## 2015-05-10 ENCOUNTER — Encounter: Payer: Self-pay | Admitting: Interventional Cardiology

## 2015-05-22 ENCOUNTER — Other Ambulatory Visit: Payer: Self-pay

## 2015-05-22 ENCOUNTER — Ambulatory Visit (HOSPITAL_COMMUNITY): Payer: Medicare Other | Attending: Cardiovascular Disease

## 2015-05-22 ENCOUNTER — Other Ambulatory Visit: Payer: Self-pay | Admitting: Internal Medicine

## 2015-05-22 DIAGNOSIS — I071 Rheumatic tricuspid insufficiency: Secondary | ICD-10-CM | POA: Diagnosis not present

## 2015-05-22 DIAGNOSIS — I351 Nonrheumatic aortic (valve) insufficiency: Secondary | ICD-10-CM | POA: Diagnosis not present

## 2015-05-22 DIAGNOSIS — R0602 Shortness of breath: Secondary | ICD-10-CM | POA: Diagnosis not present

## 2015-05-22 DIAGNOSIS — I10 Essential (primary) hypertension: Secondary | ICD-10-CM | POA: Diagnosis not present

## 2015-05-22 DIAGNOSIS — I517 Cardiomegaly: Secondary | ICD-10-CM | POA: Diagnosis not present

## 2015-05-22 DIAGNOSIS — R0600 Dyspnea, unspecified: Secondary | ICD-10-CM | POA: Insufficient documentation

## 2015-05-22 DIAGNOSIS — F17200 Nicotine dependence, unspecified, uncomplicated: Secondary | ICD-10-CM | POA: Insufficient documentation

## 2015-05-23 ENCOUNTER — Encounter: Payer: Self-pay | Admitting: Interventional Cardiology

## 2015-05-24 ENCOUNTER — Encounter: Payer: Self-pay | Admitting: Interventional Cardiology

## 2015-05-24 ENCOUNTER — Ambulatory Visit (INDEPENDENT_AMBULATORY_CARE_PROVIDER_SITE_OTHER): Payer: Medicare Other | Admitting: Interventional Cardiology

## 2015-05-24 VITALS — BP 146/56 | HR 47 | Ht 63.5 in | Wt 112.4 lb

## 2015-05-24 DIAGNOSIS — Z720 Tobacco use: Secondary | ICD-10-CM

## 2015-05-24 DIAGNOSIS — I712 Thoracic aortic aneurysm, without rupture, unspecified: Secondary | ICD-10-CM

## 2015-05-24 DIAGNOSIS — Z79899 Other long term (current) drug therapy: Secondary | ICD-10-CM

## 2015-05-24 DIAGNOSIS — J449 Chronic obstructive pulmonary disease, unspecified: Secondary | ICD-10-CM

## 2015-05-24 DIAGNOSIS — I10 Essential (primary) hypertension: Secondary | ICD-10-CM

## 2015-05-24 DIAGNOSIS — I2510 Atherosclerotic heart disease of native coronary artery without angina pectoris: Secondary | ICD-10-CM

## 2015-05-24 MED ORDER — ASPIRIN EC 81 MG PO TBEC: 81.0000 mg | DELAYED_RELEASE_TABLET | Freq: Every day | ORAL | Status: AC

## 2015-05-24 MED ORDER — ATORVASTATIN CALCIUM 10 MG PO TABS: 10.0000 mg | ORAL_TABLET | Freq: Every day | ORAL | Status: AC

## 2015-05-24 NOTE — Patient Instructions (Addendum)
Medication Instructions:  Your physician has recommended you make the following change in your medication:  1-START Atorvastatin 10 mg by mouth daily 2-START Aspirin 81 mg by mouth daily  Lab work: Your physician recommends that you return for lab work in: 6 to 8 weeks for lipid and liver panel.  Testing/Procedures: NONE  Follow-Up: Your physician wants you to follow-up in: 6 months with Dr. Katrinka Blazing. You will receive a reminder letter in the mail two months in advance. If you don't receive a letter, please call our office to schedule the follow-up appointment.  Call Dr. Tyrone Sage to follow-up [phone].   If you need a refill on your cardiac medications before your next appointment, please call your pharmacy.

## 2015-05-24 NOTE — Progress Notes (Signed)
Cardiology Office Note   Date:  05/24/2015   ID:  Lindsey Martinez, DOB December 15, 1930, MRN [id number]  PCP:  Lindsey Lights, MD  Cardiologist:  Lindsey Noe, MD   Chief Complaint  Patient presents with  . Thoracic Aortic Aneurysm      History of Present Illness: Lindsey Martinez is a 82 y.o. female who presents for ascending aortic aneurysm (5 cm), three-vessel coronary calcification by CT, low risk myocardial perfusion study done after CT scan, hypertension, sinus node dysfunction, COPD, and PAD.  She has done relatively well. She has a 5 cm ascending aortic aneurysm. She does have three-vessel coronary calcification but a low risk myocardial perfusion study. She has not had angina. I initiated low-dose beta blocker therapy and this led to significant deterioration in pulmonary function. She has not enroll in cardiac rehabilitation.  Past Medical History  Diagnosis Date  . Essential hypertension 08/31/2013  . Thoracic aortic aneurysm (HCC) 08/31/2013    Never fully characterize. The lower thoracic aorta was felt to be mildly enlarged on abdominal CT scan. Recommendations were made for a chest CT scan which was never performed   . Peripheral arterial disease (HCC) 08/31/2013    Heavy calcification noted in the abdominal aorta and into the iliacs bilaterally on CT with contrast   . Coronary artery calcification seen on CT scan 02/18/2015  . COPD (chronic obstructive pulmonary disease) (HCC) 08/31/2013  . Tobacco abuse 08/31/2013  . Abnormal EKG 08/31/2013    Anterior T-wave abnormality     Past Surgical History  Procedure Laterality Date  . Hysterotomy  1978  . Breast implant exchange  1992  . Placement of breast implants  1992  . Nasal sinus surgery  2002  . Femur fracture surgery  2005  . Hip surgery  2005  . Eye surgery  2005  . Fetal surgery for congenital hernia       Current Outpatient Prescriptions  Medication Sig Dispense Refill  . albuterol (PROVENTIL) (2.5 MG/3ML)  0.083% nebulizer solution Take 3 mLs by nebulization 4 (four) times daily.  6  . budesonide-formoterol (SYMBICORT) 160-4.5 MCG/ACT inhaler Inhale 2 puffs into the lungs 2 (two) times daily.    . Cyanocobalamin (V-R VITAMIN B-12 PO) Take 2,000 mcg by mouth daily.    Marland Kitchen dextromethorphan-guaiFENesin (MUCINEX DM) 30-600 MG 12hr tablet Take 1 tablet by mouth every 12 (twelve) hours as needed. (congestion)    . furosemide (LASIX) 40 MG tablet Take 40 mg by mouth daily.    . megestrol (MEGACE) 40 MG/ML suspension Take 10 mLs by mouth 2 (two) times daily.  2  . PRESCRIPTION MEDICATION Take 2 tablets by mouth daily. Med Name: PREDNISONE    . PRESCRIPTION MEDICATION Take 1 tablet by mouth daily. Med Name: PT UNSURE>>>ANTIBIOTIC    . PROAIR HFA 108 (90 BASE) MCG/ACT inhaler Inhale 1-2 puffs into the lungs every 6 (six) hours as needed. (Shortness of breath)  1  . tiotropium (SPIRIVA) 18 MCG inhalation capsule Place 18 mcg into inhaler and inhale daily.    . trandolapril-verapamil (TARKA) 2-240 MG per tablet Take 1 tablet by mouth daily.     No current facility-administered medications for this visit.    Allergies:   Codeine; Demerol; Lidoderm; and Sulfa antibiotics    Social History:  The patient  reports that she quit smoking about 2 months ago. She has never used smokeless tobacco. She reports that she drinks alcohol. She reports that she does not use illicit drugs.  Family History:  The patient's She was adopted. Family history is unknown by patient.    ROS:  Please see the history of present illness.   Otherwise, review of systems are positive for anxiety, cough, increased shortness of breath since starting beta blocker therapy, lower extremity swelling since starting beta blocker therapy. Leg pain, skipped heartbeats, increased wheezing, and joint swelling..   All other systems are reviewed and negative.    PHYSICAL EXAM: VS:  BP 146/56 mmHg  Pulse 47  Ht 5' 3.5" (1.613 m)  Wt 112 lb 6.4 oz  (50.984 kg)  BMI 19.60 kg/m2 , BMI Body mass index is 19.6 kg/(m^2). GEN: Well nourished, well developed, in no acute distress HEENT: normal Neck: no JVD, carotid bruits, or masses Cardiac: RRR.  There is no murmur, rub, or gallop. There is no edema. Respiratory:  clear to auscultation bilaterally, normal work of breathing. GI: soft, nontender, nondistended, + BS MS: no deformity or atrophy Skin: warm and dry, no rash Neuro:  Strength and sensation are intact Psych: euthymic mood, full affect   EKG:  EKG is not ordered today.   Recent Labs: 02/18/2015: BUN 23; Creat 1.01*; Potassium 4.4; Sodium 133*    Lipid Panel No results found for: CHOL, TRIG, HDL, CHOLHDL, VLDL, LDLCALC, LDLDIRECT    Wt Readings from Last 3 Encounters:  05/24/15 112 lb 6.4 oz (50.984 kg)  03/08/15 104 lb (47.174 kg)  03/06/15 104 lb (47.174 kg)      Other studies Reviewed: Additional studies/ records that were reviewed today include: Reviewed all of the notes provided by Lindsey Martinez.. The findings include she is to follow-up with him..    ASSESSMENT AND PLAN:  1. Thoracic aortic aneurysm without rupture (HCC) 5 cm and will be followed by Lindsey Martinez.  2. Chronic obstructive pulmonary disease, unspecified COPD type (HCC) Severe, and now enrolled in cardiopulmonary rehabilitation  3. Coronary artery disease involving native coronary artery of native heart without angina pectoris Asymptomatic coronary artery calcification on CT but with low risk myocardial perfusion study within the past 6 months.  4. Tobacco abuse Tobacco use has been discontinued  5. Essential hypertension Adequate control    Current medicines are reviewed at length with the patient today.  The patient has the following concerns regarding medicines: .  The following changes/actions have been instituted:    Aspirin 81 mg per day  Atorvastatin 10 mg per day  Discontinue beta blocker therapy because of exacerbation  of COPD  Follow-up with Lindsey Martinez with reference to ascending aortic aneurysm  Labs/ tests ordered today include:  No orders of the defined types were placed in this encounter.     Disposition:   FU with HS in 6 months  Signed, Lindsey Noe, MD  05/24/2015 11:51 AM    [facility] [address] [place], [place], [place]  [address] Phone: [phone]; Fax: [phone]

## 2015-07-12 ENCOUNTER — Other Ambulatory Visit: Payer: Medicare Other

## 2015-08-27 ENCOUNTER — Other Ambulatory Visit: Payer: Self-pay | Admitting: *Deleted

## 2015-08-27 DIAGNOSIS — I712 Thoracic aortic aneurysm, without rupture, unspecified: Secondary | ICD-10-CM

## 2015-09-19 ENCOUNTER — Inpatient Hospital Stay: Admission: RE | Admit: 2015-09-19 | Payer: Medicare Other | Source: Ambulatory Visit

## 2015-09-19 ENCOUNTER — Encounter: Payer: Medicare Other | Admitting: Cardiothoracic Surgery

## 2015-09-19 ENCOUNTER — Other Ambulatory Visit: Payer: Medicare Other

## 2015-10-30 ENCOUNTER — Encounter: Payer: Self-pay | Admitting: *Deleted

## 2015-12-13 ENCOUNTER — Encounter: Payer: Self-pay | Admitting: Interventional Cardiology

## 2015-12-13 ENCOUNTER — Ambulatory Visit (INDEPENDENT_AMBULATORY_CARE_PROVIDER_SITE_OTHER): Payer: Medicare Other | Admitting: Interventional Cardiology

## 2015-12-13 VITALS — BP 136/60 | HR 64 | Ht 63.5 in | Wt 112.4 lb

## 2015-12-13 DIAGNOSIS — I10 Essential (primary) hypertension: Secondary | ICD-10-CM | POA: Diagnosis not present

## 2015-12-13 DIAGNOSIS — I712 Thoracic aortic aneurysm, without rupture, unspecified: Secondary | ICD-10-CM

## 2015-12-13 DIAGNOSIS — I2510 Atherosclerotic heart disease of native coronary artery without angina pectoris: Secondary | ICD-10-CM | POA: Diagnosis not present

## 2015-12-13 DIAGNOSIS — J410 Simple chronic bronchitis: Secondary | ICD-10-CM

## 2015-12-13 DIAGNOSIS — I739 Peripheral vascular disease, unspecified: Secondary | ICD-10-CM | POA: Diagnosis not present

## 2015-12-13 NOTE — Patient Instructions (Signed)
Your physician recommends that you continue on your current medications as directed. Please refer to the Current Medication list given to you today.  Your physician wants you to follow-up in: 6-8 months with Dr.Smith You will receive a reminder letter in the mail two months in advance. If you don't receive a letter, please call our office to schedule the follow-up appointment.  

## 2015-12-13 NOTE — Progress Notes (Signed)
Cardiology Office Note    Date:  12/13/2015   ID:  Lindsey Martinez, DOB 03/24/1931, MRN [id number]  PCP:  Ninetta LightsFURR,SARA, MD  Cardiologist: Lindsey Martinez W Aryiana Klinkner III, MD   Chief Complaint  Patient presents with  . Thoracic Aortic Aneurysm  . COPD    History of Present Illness:  Lindsey Martinez is a 82 y.o. female for follow-up of asymptomatic coronary artery disease (coronary artery calcification by CT, nonischemic myocardial perfusion study 2016), hypertension, large ascending aortic and thoracic aneurysm (5 cm), and near end-stage COPD.  Lindsey Martinez continues to smoke. She has no chest discomfort. Beta blocker therapy could not be tolerated. She has significant decompensation and pulmonary function over the last 6 months and was hospitalized. She is tolerating her current medical regimen without difficulty.  Past Medical History:  Diagnosis Date  . Abnormal EKG 08/31/2013   Anterior T-wave abnormality   . COPD (chronic obstructive pulmonary disease) (HCC) 08/31/2013  . Coronary artery calcification seen on CT scan 02/18/2015  . Essential hypertension 08/31/2013  . Peripheral arterial disease (HCC) 08/31/2013   Heavy calcification noted in the abdominal aorta and into the iliacs bilaterally on CT with contrast   . Thoracic aortic aneurysm (HCC) 08/31/2013   Never fully characterize. The lower thoracic aorta was felt to be mildly enlarged on abdominal CT scan. Recommendations were made for a chest CT scan which was never performed   . Tobacco abuse 08/31/2013    Past Surgical History:  Procedure Laterality Date  . BREAST IMPLANT EXCHANGE  1992  . EYE SURGERY  2005  . FEMUR FRACTURE SURGERY  2005  . FETAL SURGERY FOR CONGENITAL HERNIA    . HIP SURGERY  2005  . HYSTEROTOMY  1978  . NASAL SINUS SURGERY  2002  . PLACEMENT OF BREAST IMPLANTS  1992    Current Medications: Outpatient Medications Prior to Visit  Medication Sig Dispense Refill  . albuterol (PROVENTIL) (2.5 MG/3ML) 0.083%  nebulizer solution Take 3 mLs by nebulization 4 (four) times daily.  6  . aspirin EC 81 MG tablet Take 1 tablet (81 mg total) by mouth daily. 90 tablet 3  . atorvastatin (LIPITOR) 10 MG tablet Take 1 tablet (10 mg total) by mouth daily. 90 tablet 3  . budesonide-formoterol (SYMBICORT) 160-4.5 MCG/ACT inhaler Inhale 2 puffs into the lungs 2 (two) times daily.    . Cyanocobalamin (V-R VITAMIN B-12 PO) Take 2,000 mcg by mouth daily.    Marland Kitchen. dextromethorphan-guaiFENesin (MUCINEX DM) 30-600 MG 12hr tablet Take 1 tablet by mouth every 12 (twelve) hours as needed. (congestion)    . furosemide (LASIX) 40 MG tablet Take 40 mg by mouth daily.    . megestrol (MEGACE) 40 MG/ML suspension Take 10 mLs by mouth 2 (two) times daily.  2  . PROAIR HFA 108 (90 BASE) MCG/ACT inhaler Inhale 1-2 puffs into the lungs every 6 (six) hours as needed. (Shortness of breath)  1  . tiotropium (SPIRIVA) 18 MCG inhalation capsule Place 18 mcg into inhaler and inhale daily.    . trandolapril-verapamil (TARKA) 2-240 MG per tablet Take 1 tablet by mouth daily.    Marland Kitchen. PRESCRIPTION MEDICATION Take 2 tablets by mouth daily. Med Name: PREDNISONE    . PRESCRIPTION MEDICATION Take 1 tablet by mouth daily. Med Name: PT UNSURE>>>ANTIBIOTIC     No facility-administered medications prior to visit.      Allergies:   Bee venom; Codeine; Demerol [meperidine]; Lidoderm [lidocaine]; Prednisone; and Sulfa antibiotics   Social History  Social History  . Marital status: Married    Spouse name: N/A  . Number of children: N/A  . Years of education: N/A   Social History Main Topics  . Smoking status: Former Smoker    Quit date: 03/16/2015  . Smokeless tobacco: Never Used  . Alcohol use 0.0 oz/week  . Drug use: No  . Sexual activity: No   Other Topics Concern  . None   Social History Narrative  . None     Family History:  The patient's She was adopted. Family history is unknown by patient.   ROS:   Please see the history of present  illness.    Cough, wheezing, facial swelling, anxiety, joint swelling, easy bruising, rash, and back pain. She was unable to tolerate prednisone therapy.  All other systems reviewed and are negative.   PHYSICAL EXAM:   VS:  BP 136/60   Pulse 64   Ht 5' 3.5" (1.613 m)   Wt 112 lb 6.4 oz (51 kg)   SpO2 99%   BMI 19.60 kg/m    GEN: Well nourished, well developed, in no acute distress. Very thin and near cachectic appearing.  HEENT: normal  Neck: no JVD, carotid bruits, or masses Cardiac: IRR; no murmurs, rubs, or gallops,no edema  Respiratory:  clear to auscultation bilaterally, normal work of breathing GI: soft, nontender, nondistended, + BS MS: no deformity or atrophy  Skin: warm and dry, no rash Neuro:  Alert and Oriented x 3, Strength and sensation are intact Psych: euthymic mood, full affect  Wt Readings from Last 3 Encounters:  12/13/15 112 lb 6.4 oz (51 kg)  05/24/15 112 lb 6.4 oz (51 kg)  03/08/15 104 lb (47.2 kg)      Studies/Labs Reviewed:   EKG:  EKG  ECG is abnormal demonstrating ectopic atrial pacemaker, blocked PACs, occasional junctional rhythm, narrow QRS complex and no evidence of infarction.  Recent Labs: 02/18/2015: BUN 23; Creat 1.01; Potassium 4.4; Sodium 133   Lipid Panel No results found for: CHOL, TRIG, HDL, CHOLHDL, VLDL, LDLCALC, LDLDIRECT  Additional studies/ records that were reviewed today include:  Reviewed data from her pulmonologist Dr. Glenard Haring. Kari Baars. his working diagnosis is COPD, moderate.    ASSESSMENT:    1. Essential hypertension   2. Thoracic aortic aneurysm without rupture (HCC)   3. Peripheral arterial disease (HCC)   4. Coronary artery calcification seen on CT scan   5. Simple chronic bronchitis (HCC)      PLAN:  In order of problems listed above:  1. 2 g sodium diet is discussed. Continue Tarka. 2. Unable to use beta blocker therapy because of severe COPD and aggravation of bronchospasm. We will continue to  follow this problem in a comanagement fashion with Dr. Tyrone Sage. 3. Not addressed 4. Asymptomatic 5. Encouraged smoking cessation    Medication Adjustments/Labs and Tests Ordered: Current medicines are reviewed at length with the patient today.  Concerns regarding medicines are outlined above.  Medication changes, Labs and Tests ordered today are listed in the Patient Instructions below. There are no Patient Instructions on file for this visit.   Signed, Lindsey Noe, MD  12/13/2015 4:31 PM    [facility] [address] [place], [place], [place]  [address] Phone: [phone]; Fax: [phone]

## 2016-05-07 DEATH — deceased

## 2016-11-05 IMAGING — NM NM MISC PROCEDURE
6 series · 36 of 36 positions shown · non-contrast
Comparison: none

[Series 1: wbr_s-card_st stress-gsp · 6.4mm · 6.40mm/px · 6 of 224 frames shown]
[frame 19/224]
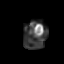
[frame 56/224]
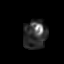
[frame 94/224]
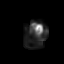
[frame 131/224]
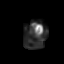
[frame 168/224]
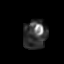
[frame 206/224]
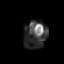

[Series 1: rest · 6.40mm/px · 6 of 64 frames shown]
[frame 6/64]
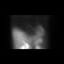
[frame 16/64]
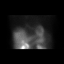
[frame 27/64]
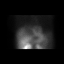
[frame 38/64]
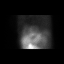
[frame 48/64]
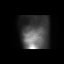
[frame 59/64]
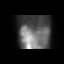

[Series 1: wbr_r-card_st rest · 6.4mm · 6.40mm/px · 6 of 22 frames shown]
[frame 2/22]
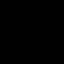
[frame 6/22]
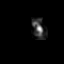
[frame 10/22]
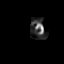
[frame 13/22]
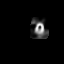
[frame 17/22]
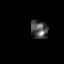
[frame 21/22]
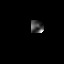

[Series 1: wbr_s-card_st stress-sum-em · 6.4mm · 6.40mm/px · 6 of 27 frames shown]
[frame 3/27]
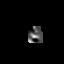
[frame 7/27]
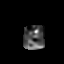
[frame 12/27]
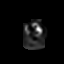
[frame 16/27]
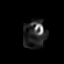
[frame 21/27]
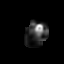
[frame 25/27]
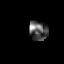

[Series 1: stress-sum-em · 6.40mm/px · 6 of 64 frames shown]
[frame 6/64]
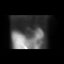
[frame 16/64]
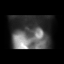
[frame 27/64]
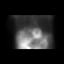
[frame 38/64]
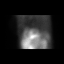
[frame 48/64]
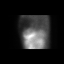
[frame 59/64]
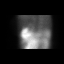

[Series 1: stress-gsp · 6.40mm/px · 6 of 512 frames shown]
[frame 43/512]
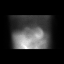
[frame 128/512]
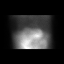
[frame 214/512]
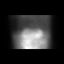
[frame 299/512]
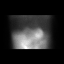
[frame 384/512]
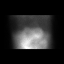
[frame 470/512]
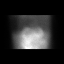

[36 of 36 positions shown; findings below may reference images not displayed]

Canned report from images found in remote index.

Refer to host system for actual result text.
